# Patient Record
Sex: Female | Born: 2000 | Race: White | Hispanic: No | Marital: Single
Health system: Southern US, Community
[De-identification: ages and names within clinical notes are randomized; demographics above are authoritative.]

---
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
## 2022-07-07 IMAGING — DX DG HAND COMPLETE 3+V*R*
3 series · 3 of 3 positions shown · non-contrast
Comparison: None.

CLINICAL DATA: Hit a wall yesterday. Right hand pain and swelling.
Right wrist pain. Right shoulder pain.

EXAM:
RIGHT HAND - COMPLETE 3+ VIEW

[x hand pa right]
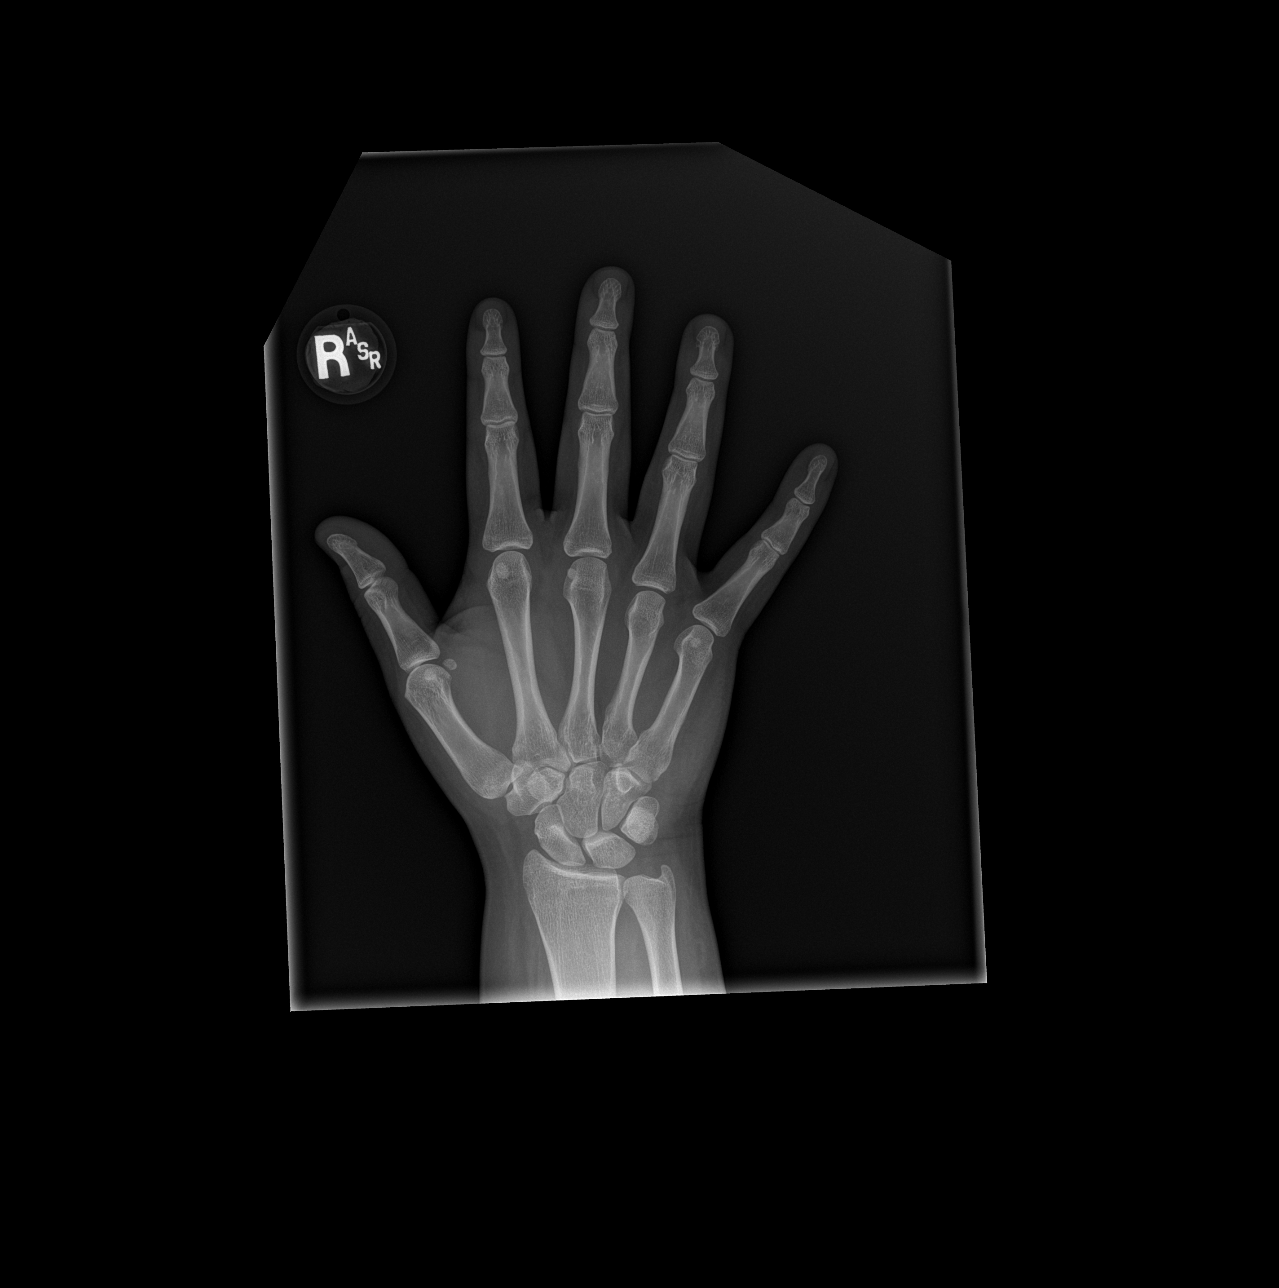

[x hand obl right]
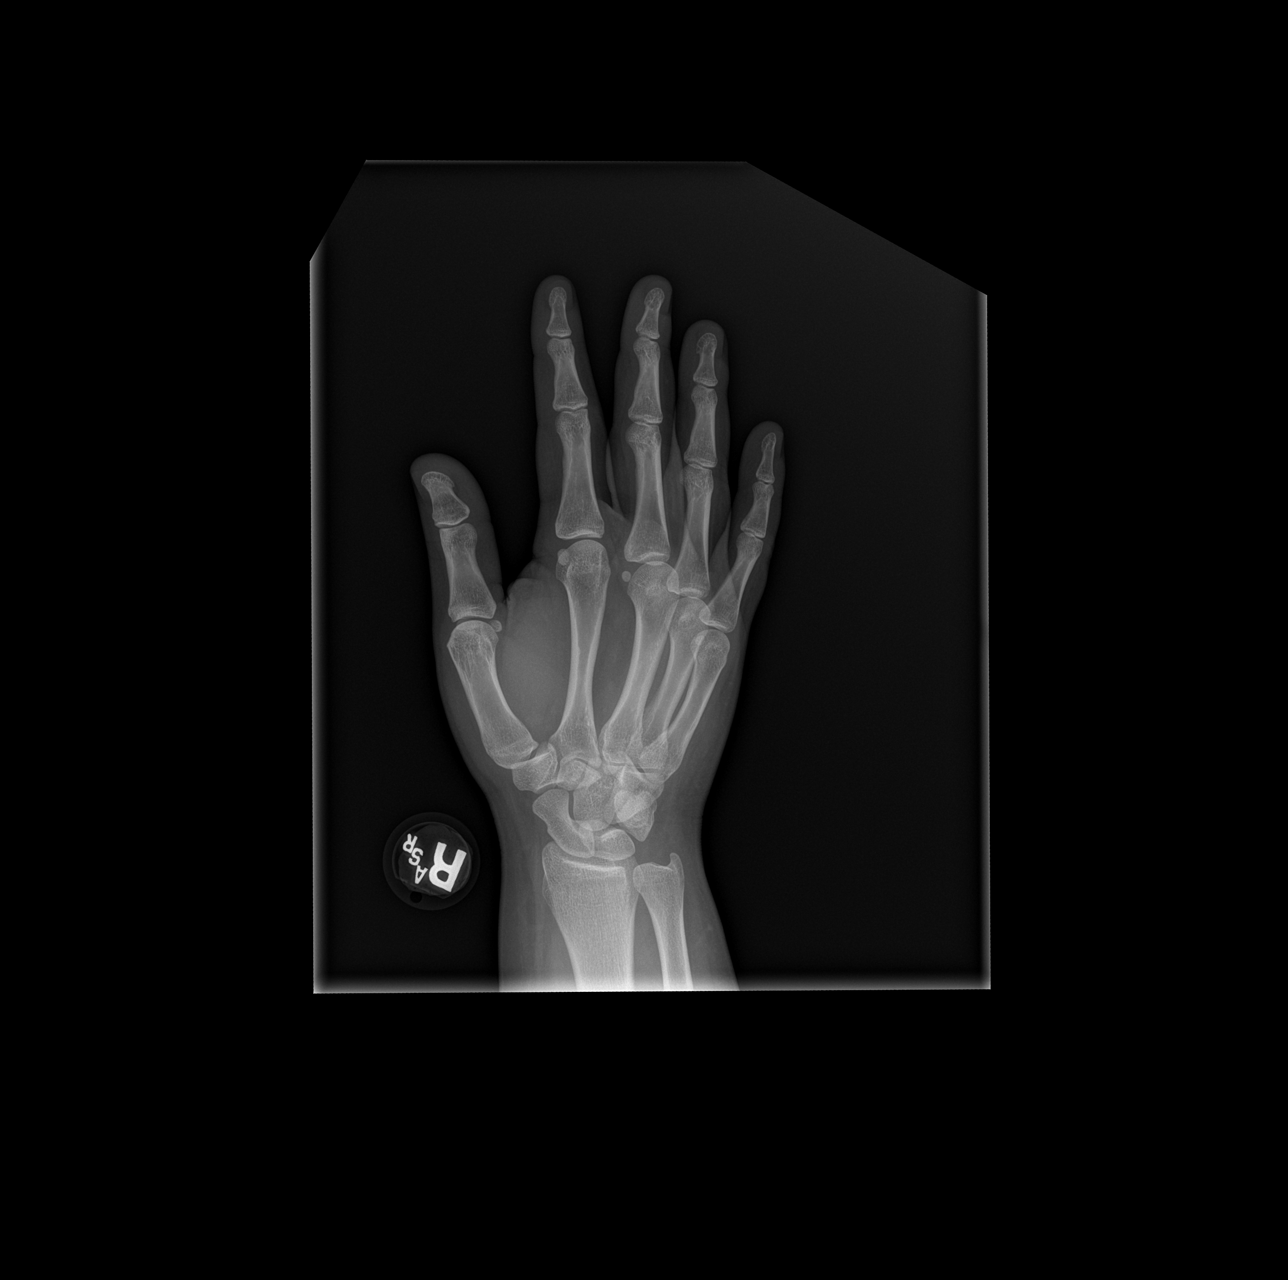

[x hand lat right]
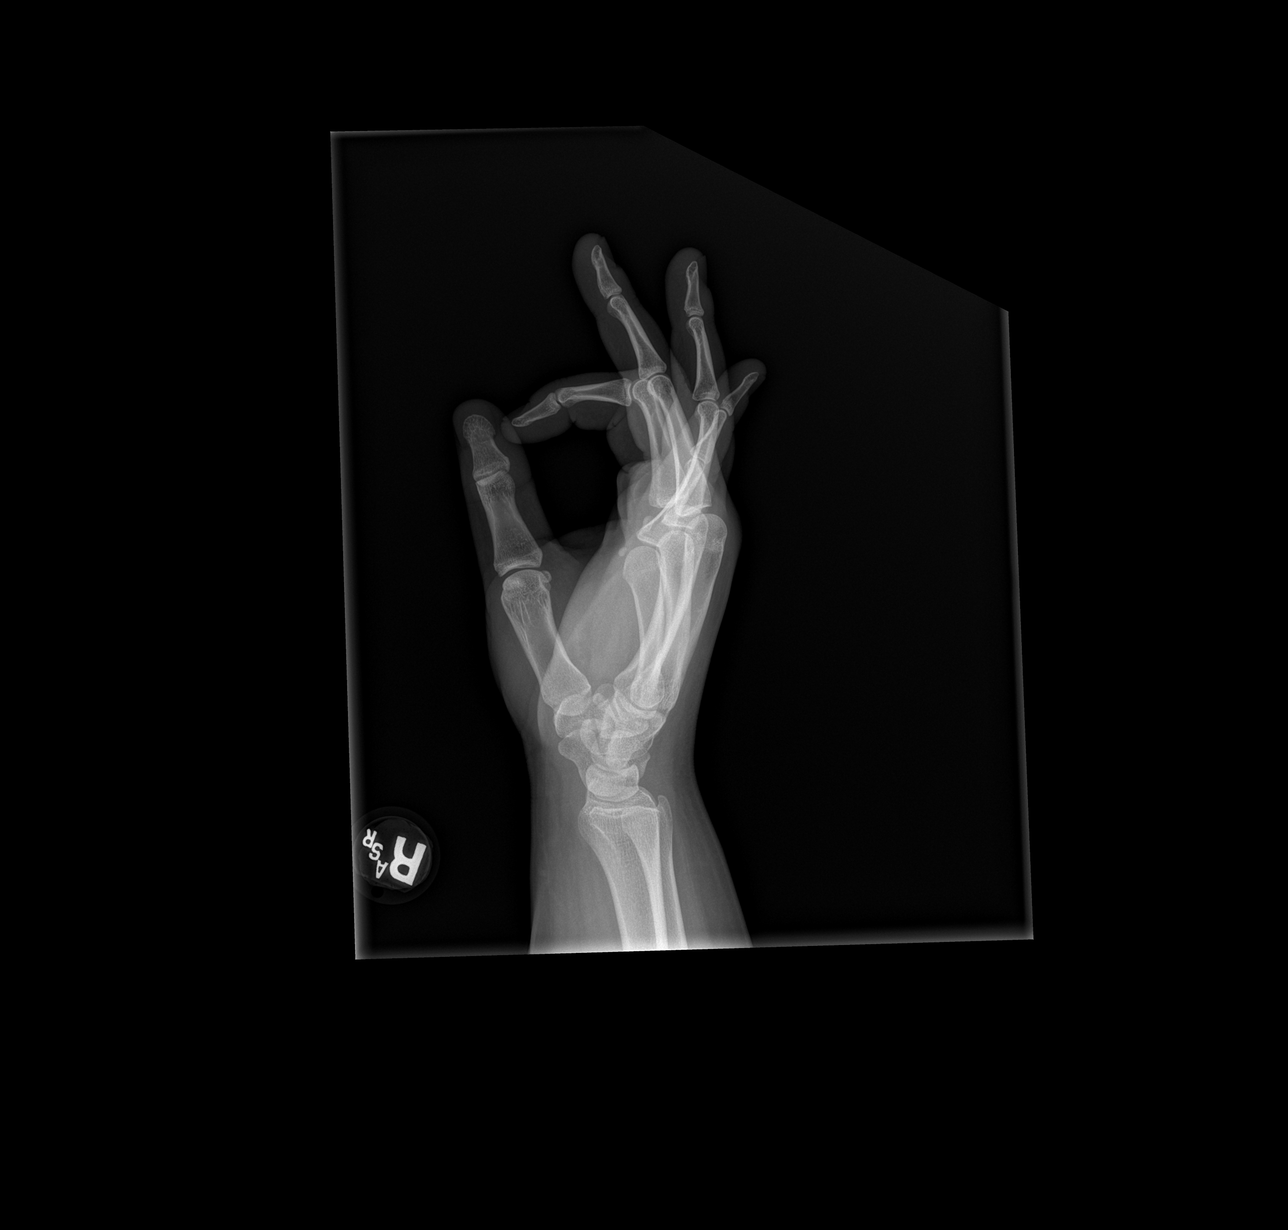

[3 of 3 positions shown; findings below may reference images not displayed]

FINDINGS: There is no evidence of fracture or dislocation. There is no
evidence of arthropathy or other focal bone abnormality. Soft
tissues are unremarkable.
IMPRESSION: Negative.

## 2022-07-07 IMAGING — DX DG WRIST COMPLETE 3+V*R*
4 series · 4 of 4 positions shown · non-contrast
Comparison: None.

CLINICAL DATA: Hit a wall yesterday. Right hand pain and swelling.
Right wrist pain. Right shoulder pain.

EXAM:
RIGHT WRIST - COMPLETE 3+ VIEW

[x wrist pa right]
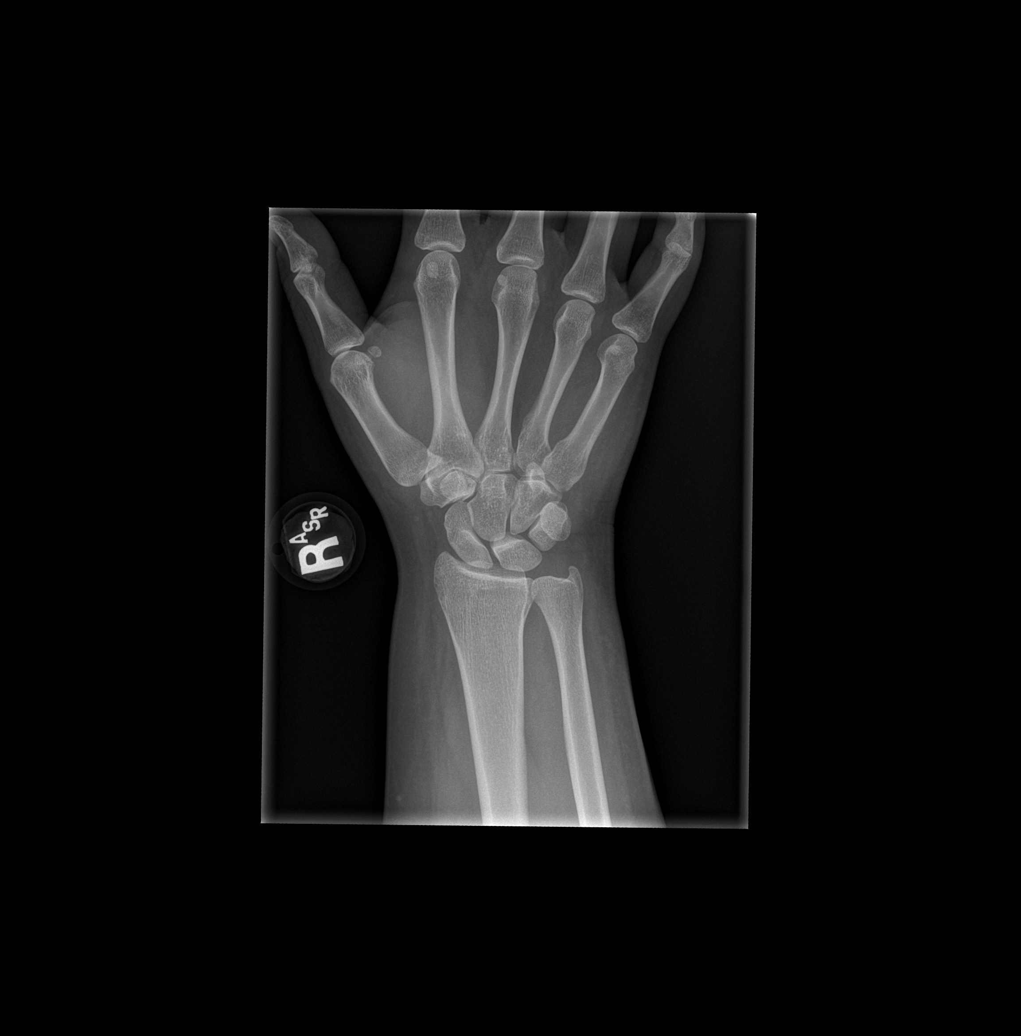

[x wrist obl right]
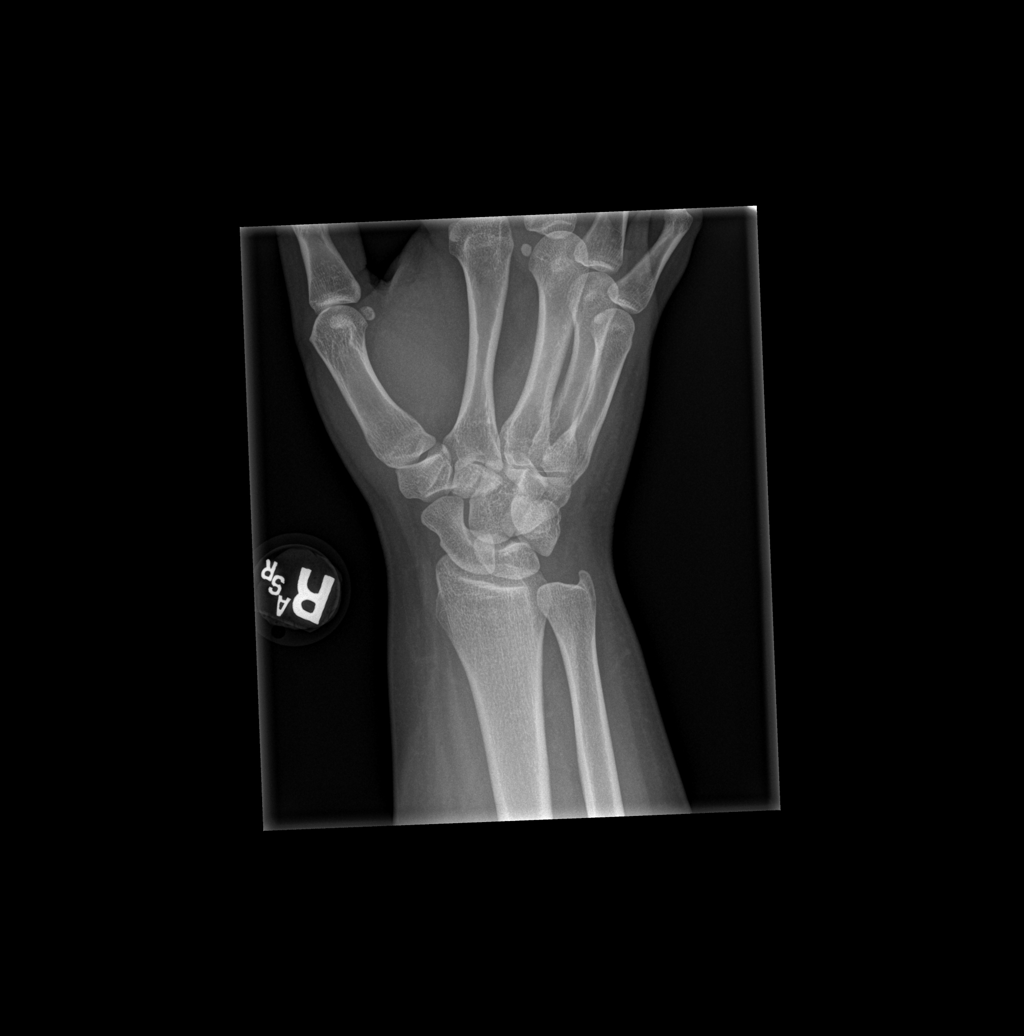

[x wrist lat right]
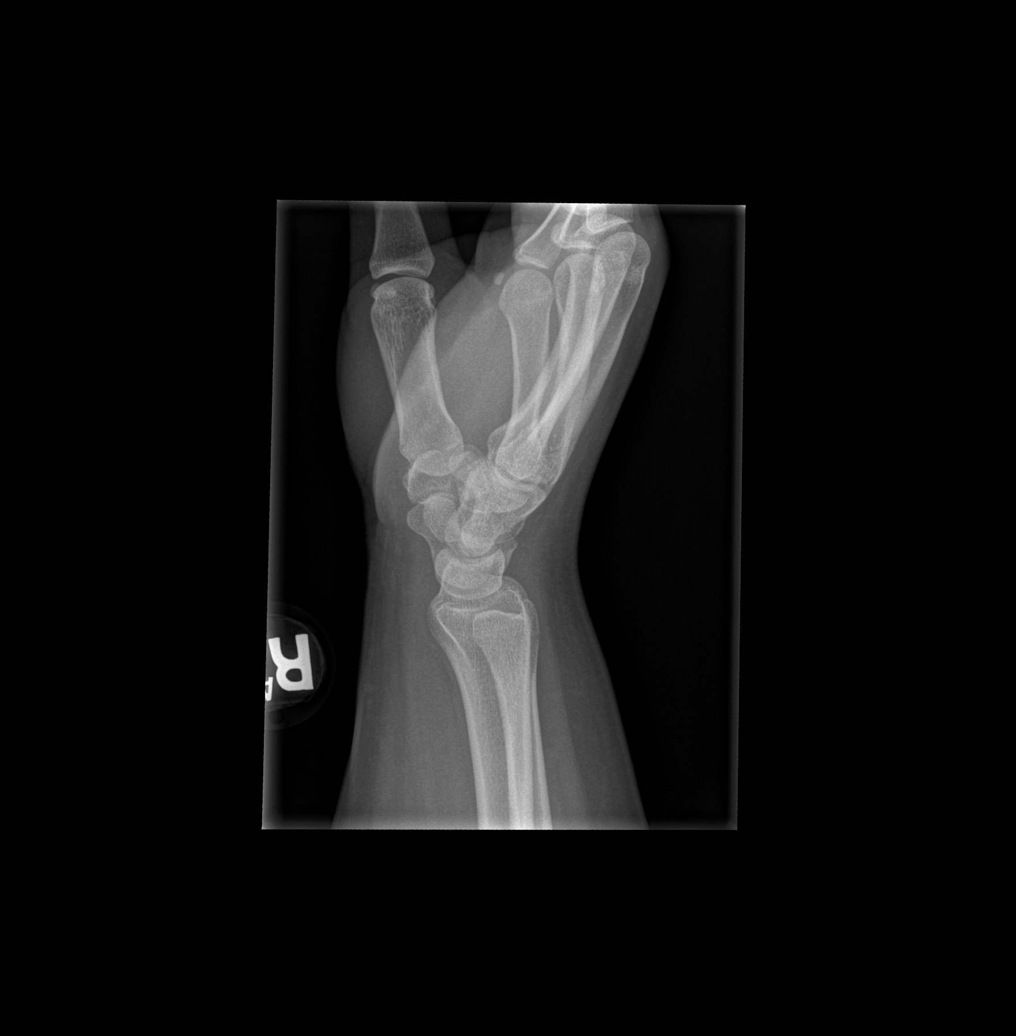

[x wrist navicular view right]
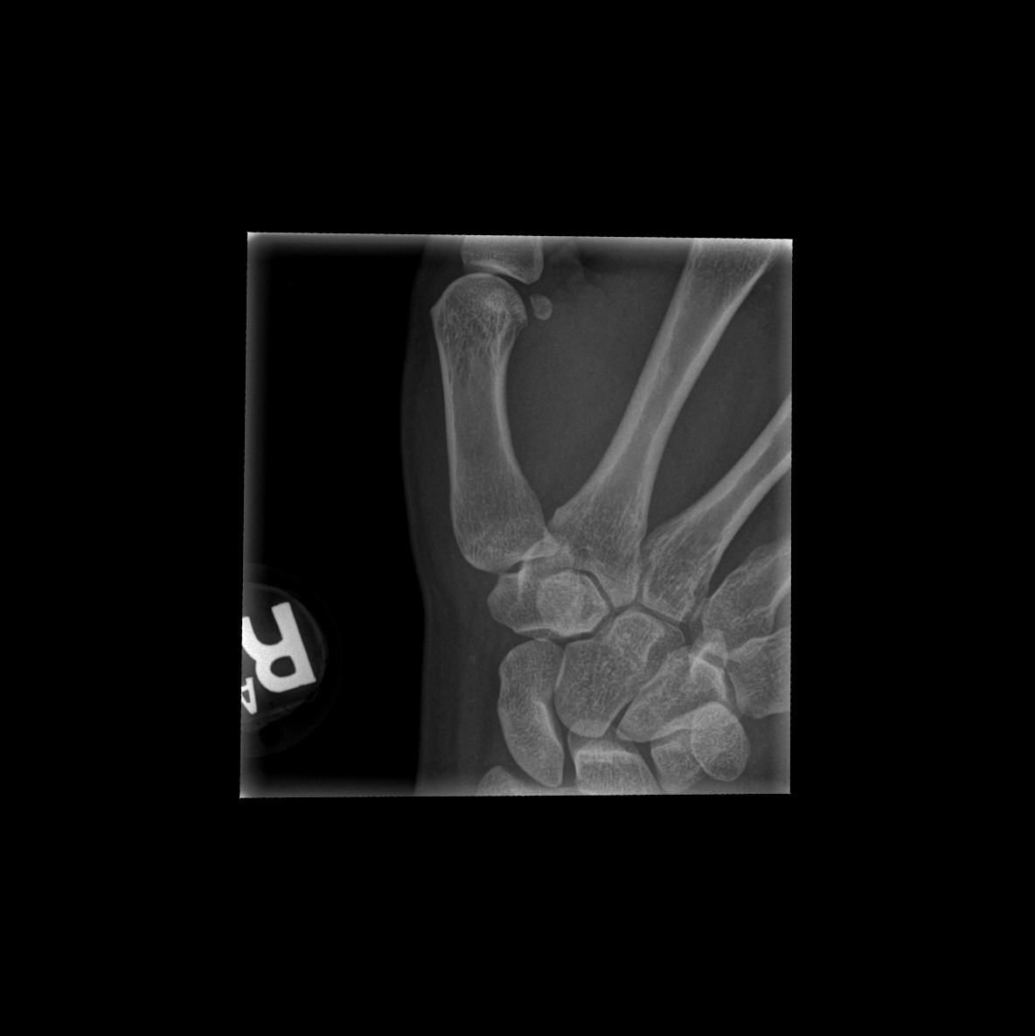

[4 of 4 positions shown; findings below may reference images not displayed]

FINDINGS: There is no evidence of fracture or dislocation. There is no
evidence of arthropathy or other focal bone abnormality. Soft
tissues are unremarkable.
IMPRESSION: Negative.
